# Patient Record
Sex: Female | Born: 1970 | Race: Black or African American | Hispanic: No | Marital: Single | Smoking: Former smoker
Health system: Southern US, Community
[De-identification: ages and names within clinical notes are randomized; demographics above are authoritative.]

## PROBLEM LIST (undated history)

## (undated) DIAGNOSIS — I10 Essential (primary) hypertension: Secondary | ICD-10-CM

## (undated) DIAGNOSIS — E119 Type 2 diabetes mellitus without complications: Secondary | ICD-10-CM

## (undated) HISTORY — PX: APPENDECTOMY: SHX54

## (undated) HISTORY — PX: TONSILLECTOMY: SUR1361

## (undated) HISTORY — PX: REDUCTION MAMMAPLASTY: SUR839

## (undated) HISTORY — PX: HERNIA REPAIR: SHX51

---

## 1991-06-06 HISTORY — PX: REDUCTION MAMMAPLASTY: SUR839

## 2017-06-06 ENCOUNTER — Other Ambulatory Visit: Payer: Self-pay | Admitting: Family Medicine

## 2017-06-06 DIAGNOSIS — Z1231 Encounter for screening mammogram for malignant neoplasm of breast: Secondary | ICD-10-CM

## 2017-06-14 ENCOUNTER — Inpatient Hospital Stay: Admission: RE | Admit: 2017-06-14 | Payer: Self-pay | Source: Ambulatory Visit

## 2017-06-27 ENCOUNTER — Inpatient Hospital Stay: Admission: RE | Admit: 2017-06-27 | Payer: Self-pay | Source: Ambulatory Visit

## 2017-07-11 ENCOUNTER — Encounter (INDEPENDENT_AMBULATORY_CARE_PROVIDER_SITE_OTHER): Payer: Self-pay

## 2017-07-11 ENCOUNTER — Ambulatory Visit
Admission: RE | Admit: 2017-07-11 | Discharge: 2017-07-11 | Disposition: A | Discharge status: Home / Self Care | Payer: BLUE CROSS/BLUE SHIELD | Source: Ambulatory Visit | Attending: Family Medicine | Admitting: Family Medicine

## 2017-07-11 DIAGNOSIS — Z1231 Encounter for screening mammogram for malignant neoplasm of breast: Secondary | ICD-10-CM | POA: Diagnosis not present

## 2017-07-20 ENCOUNTER — Other Ambulatory Visit: Payer: Self-pay | Admitting: *Deleted

## 2017-07-20 ENCOUNTER — Inpatient Hospital Stay
Admission: RE | Admit: 2017-07-20 | Discharge: 2017-07-20 | Disposition: A | Discharge status: Home / Self Care | Payer: Self-pay | Source: Ambulatory Visit | Attending: *Deleted | Admitting: *Deleted

## 2017-07-20 DIAGNOSIS — Z9289 Personal history of other medical treatment: Secondary | ICD-10-CM

## 2018-07-03 HISTORY — PX: ABDOMINAL HYSTERECTOMY: SHX81

## 2018-08-28 ENCOUNTER — Encounter: Admission: RE | Payer: Self-pay | Source: Home / Self Care

## 2018-08-28 ENCOUNTER — Ambulatory Visit: Admission: RE | Admit: 2018-08-28 | Payer: BLUE CROSS/BLUE SHIELD | Source: Home / Self Care | Admitting: Podiatry

## 2018-08-28 SURGERY — OSTECTOMY
Anesthesia: Choice | Laterality: Right

## 2019-03-19 ENCOUNTER — Other Ambulatory Visit: Payer: Self-pay | Admitting: Podiatry

## 2019-04-01 ENCOUNTER — Encounter: Payer: Self-pay | Admitting: *Deleted

## 2019-04-01 ENCOUNTER — Other Ambulatory Visit: Payer: Self-pay

## 2019-04-08 ENCOUNTER — Encounter
Admission: RE | Admit: 2019-04-08 | Discharge: 2019-04-08 | Disposition: A | Discharge status: Home / Self Care | Payer: BC Managed Care – PPO | Source: Ambulatory Visit | Attending: Podiatry | Admitting: Podiatry

## 2019-04-08 ENCOUNTER — Other Ambulatory Visit: Payer: Self-pay

## 2019-04-08 DIAGNOSIS — Z20828 Contact with and (suspected) exposure to other viral communicable diseases: Secondary | ICD-10-CM | POA: Diagnosis not present

## 2019-04-08 DIAGNOSIS — Z01812 Encounter for preprocedural laboratory examination: Secondary | ICD-10-CM | POA: Diagnosis not present

## 2019-04-08 LAB — SARS CORONAVIRUS 2 (TAT 6-24 HRS): SARS Coronavirus 2: NEGATIVE

## 2019-04-08 NOTE — Patient Instructions (Signed)
Your procedure is scheduled on: Friday 11/6 Report to Day Surgery. To find out your arrival time please call [phone] between 1PM - 3PM on Thurs 11/5.  Remember: Instructions that are not followed completely may result in serious medical risk,  up to and including death, or upon the discretion of your surgeon and anesthesiologist your  surgery may need to be rescheduled.     _X__ 1. Do not eat food after midnight the night before your procedure.                 No gum chewing or hard candies. You may drink clear liquids up to 2 hours                 before you are scheduled to arrive for your surgery- DO not drink clear                 liquids within 2 hours of the start of your surgery.                 Clear Liquids include:  water, Gatorade, Black Coffee or Tea (Do not add                 anything to coffee or tea).  __X__2.  On the morning of surgery brush your teeth with toothpaste and water, you                may rinse your mouth with mouthwash if you wish.  Do not swallow any toothpaste of mouthwash.     _X__ 3.  No Alcohol for 24 hours before or after surgery.   _X__ 4.  Do Not Smoke or use e-cigarettes For 24 Hours Prior to Your Surgery.                 Do not use any chewable tobacco products for at least 6 hours prior to                 surgery.  ____  5.  Bring all medications with you on the day of surgery if instructed.   ____  6.  Notify your doctor if there is any change in your medical condition      (cold, fever, infections).     Do not wear jewelry, make-up, hairpins, clips or nail polish.(MD gave permission to keep nail polish on) Do not wear lotions, powders, or perfumes. You may wear deodorant. Do not shave 48 hours prior to surgery. Men may shave face and neck. Do not bring valuables to the hospital.    [facility] is not responsible for any belongings or valuables.  Contacts, dentures or bridgework may not be worn into  surgery. Leave your suitcase in the car. After surgery it may be brought to your room. For patients admitted to the hospital, discharge time is determined by your treatment team.   Patients discharged the day of surgery will not be allowed to drive home.   Please read over the following fact sheets that you were given:    __x__ Take these medicines the morning of surgery with A SIP OF WATER:    1. amLODipine (NORVASC) 10 MG tablet  2. esomeprazole (NEXIUM) 20 MG capsule the night before and morning of surgery  3.   4.  5.  6.  ____ Fleet Enema (as directed)   _x__ Use CHG Soap as directed  ____ Use inhalers on the day of surgery  ____ Stop metformin  2 days prior to surgery    ____ Take 1/2 of usual insulin dose the night before surgery. No insulin the morning          of surgery.   ____ Stop Coumadin/Plavix/aspirin on   _x___ Stop Anti-inflammatories Aleve ibuprofen aspirin today    May take tylenol   ___x_ Stop supplements until after surgery.  Apple Cider Vinegar 600 MG CAPS, Wild Yam, Dioscorea villosa, (WILD YAM PO)  ____ Bring C-Pap to the hospital.

## 2019-04-11 ENCOUNTER — Encounter: Payer: Self-pay | Admitting: *Deleted

## 2019-04-11 ENCOUNTER — Encounter
Admission: RE | Disposition: A | Discharge status: Home / Self Care | Payer: Self-pay | Source: Ambulatory Visit | Attending: Podiatry

## 2019-04-11 ENCOUNTER — Ambulatory Visit
Admission: RE | Admit: 2019-04-11 | Discharge: 2019-04-11 | Disposition: A | Discharge status: Home / Self Care | Payer: BC Managed Care – PPO | Source: Ambulatory Visit | Attending: Podiatry | Admitting: Podiatry

## 2019-04-11 ENCOUNTER — Encounter: Payer: Self-pay | Admitting: Anesthesiology

## 2019-04-11 ENCOUNTER — Other Ambulatory Visit: Payer: Self-pay

## 2019-04-11 DIAGNOSIS — E119 Type 2 diabetes mellitus without complications: Secondary | ICD-10-CM | POA: Insufficient documentation

## 2019-04-11 DIAGNOSIS — M2011 Hallux valgus (acquired), right foot: Secondary | ICD-10-CM | POA: Diagnosis not present

## 2019-04-11 DIAGNOSIS — Z87891 Personal history of nicotine dependence: Secondary | ICD-10-CM | POA: Diagnosis not present

## 2019-04-11 DIAGNOSIS — Z793 Long term (current) use of hormonal contraceptives: Secondary | ICD-10-CM | POA: Insufficient documentation

## 2019-04-11 DIAGNOSIS — I10 Essential (primary) hypertension: Secondary | ICD-10-CM | POA: Insufficient documentation

## 2019-04-11 DIAGNOSIS — Z6841 Body Mass Index (BMI) 40.0 and over, adult: Secondary | ICD-10-CM | POA: Diagnosis not present

## 2019-04-11 DIAGNOSIS — Z79899 Other long term (current) drug therapy: Secondary | ICD-10-CM | POA: Insufficient documentation

## 2019-04-11 DIAGNOSIS — Z7984 Long term (current) use of oral hypoglycemic drugs: Secondary | ICD-10-CM | POA: Insufficient documentation

## 2019-04-11 DIAGNOSIS — Z888 Allergy status to other drugs, medicaments and biological substances status: Secondary | ICD-10-CM | POA: Insufficient documentation

## 2019-04-11 HISTORY — PX: BUNIONECTOMY: SHX129

## 2019-04-11 HISTORY — DX: Essential (primary) hypertension: I10

## 2019-04-11 HISTORY — DX: Type 2 diabetes mellitus without complications: E11.9

## 2019-04-11 LAB — GLUCOSE, CAPILLARY
Glucose-Capillary: 336 mg/dL — ABNORMAL HIGH (ref 70–99)
Glucose-Capillary: 289 mg/dL — ABNORMAL HIGH (ref 70–99)

## 2019-04-11 SURGERY — BUNIONECTOMY
Anesthesia: General | Site: Toe | Laterality: Right

## 2019-04-11 MED ORDER — POVIDONE-IODINE 7.5 % EX SOLN
Freq: Once | CUTANEOUS | Status: DC
  Filled 2019-04-11: qty 118

## 2019-04-11 MED ORDER — PROPOFOL 10 MG/ML IV BOLUS
INTRAVENOUS | Status: AC
  Filled 2019-04-11: qty 20

## 2019-04-11 MED ORDER — BUPIVACAINE HCL (PF) 0.5 % IJ SOLN
INTRAMUSCULAR | Status: DC | PRN
  Administered 2019-04-11: 20 mL

## 2019-04-11 MED ORDER — INSULIN ASPART 100 UNIT/ML ~~LOC~~ SOLN
SUBCUTANEOUS | Status: AC
  Filled 2019-04-11: qty 1

## 2019-04-11 MED ORDER — INSULIN ASPART 100 UNIT/ML ~~LOC~~ SOLN
8.0000 [IU] | Freq: Once | SUBCUTANEOUS | Status: AC
  Administered 2019-04-11: 09:00:00 8 [IU] via SUBCUTANEOUS

## 2019-04-11 MED ORDER — MIDAZOLAM HCL 2 MG/2ML IJ SOLN
INTRAMUSCULAR | Status: AC
  Filled 2019-04-11: qty 2

## 2019-04-11 MED ORDER — SODIUM CHLORIDE 0.9 % IV SOLN
INTRAVENOUS | Status: DC
  Administered 2019-04-11: 09:00:00 via INTRAVENOUS

## 2019-04-11 MED ORDER — FENTANYL CITRATE (PF) 100 MCG/2ML IJ SOLN
INTRAMUSCULAR | Status: DC | PRN
  Administered 2019-04-11: 100 ug via INTRAVENOUS

## 2019-04-11 MED ORDER — PROPOFOL 500 MG/50ML IV EMUL
INTRAVENOUS | Status: DC | PRN
  Administered 2019-04-11: 40 ug/kg/min via INTRAVENOUS

## 2019-04-11 MED ORDER — CEFAZOLIN SODIUM-DEXTROSE 2-4 GM/100ML-% IV SOLN
2.0000 g | INTRAVENOUS | Status: AC
  Administered 2019-04-11: 2 g via INTRAVENOUS

## 2019-04-11 MED ORDER — BUPIVACAINE HCL (PF) 0.5 % IJ SOLN
INTRAMUSCULAR | Status: AC
  Filled 2019-04-11: qty 30

## 2019-04-11 MED ORDER — LIDOCAINE HCL (PF) 1 % IJ SOLN
INTRAMUSCULAR | Status: AC
  Filled 2019-04-11: qty 30

## 2019-04-11 MED ORDER — FENTANYL CITRATE (PF) 100 MCG/2ML IJ SOLN
INTRAMUSCULAR | Status: AC
  Filled 2019-04-11: qty 2

## 2019-04-11 MED ORDER — LIDOCAINE HCL (CARDIAC) PF 100 MG/5ML IV SOSY
PREFILLED_SYRINGE | INTRAVENOUS | Status: DC | PRN
  Administered 2019-04-11: 80 mg via INTRAVENOUS

## 2019-04-11 MED ORDER — KETAMINE HCL 10 MG/ML IJ SOLN
INTRAMUSCULAR | Status: DC | PRN
  Administered 2019-04-11: 20 mg via INTRAVENOUS

## 2019-04-11 MED ORDER — MIDAZOLAM HCL 5 MG/5ML IJ SOLN
INTRAMUSCULAR | Status: DC | PRN
  Administered 2019-04-11: 2 mg via INTRAVENOUS

## 2019-04-11 MED ORDER — ONDANSETRON HCL 4 MG/2ML IJ SOLN: 4.0000 mg | Freq: Once | INTRAMUSCULAR | Status: DC | PRN

## 2019-04-11 MED ORDER — OXYCODONE-ACETAMINOPHEN 5-325 MG PO TABS: 1.0000 | ORAL_TABLET | Freq: Four times a day (QID) | ORAL | 0 refills | Status: AC | PRN

## 2019-04-11 MED ORDER — PROPOFOL 10 MG/ML IV BOLUS
INTRAVENOUS | Status: DC | PRN
  Administered 2019-04-11: 26 mg via INTRAVENOUS

## 2019-04-11 MED ORDER — BUPIVACAINE LIPOSOME 1.3 % IJ SUSP
INTRAMUSCULAR | Status: AC
  Filled 2019-04-11: qty 20

## 2019-04-11 MED ORDER — SODIUM CHLORIDE FLUSH 0.9 % IV SOLN
INTRAVENOUS | Status: AC
  Filled 2019-04-11: qty 50

## 2019-04-11 MED ORDER — BUPIVACAINE LIPOSOME 1.3 % IJ SUSP
INTRAMUSCULAR | Status: DC | PRN
  Administered 2019-04-11 (×2): 10 mL

## 2019-04-11 MED ORDER — CEFAZOLIN SODIUM-DEXTROSE 2-4 GM/100ML-% IV SOLN
INTRAVENOUS | Status: AC
  Filled 2019-04-11: qty 100

## 2019-04-11 MED ORDER — FENTANYL CITRATE (PF) 100 MCG/2ML IJ SOLN: 25.0000 ug | INTRAMUSCULAR | Status: DC | PRN

## 2019-04-11 MED ORDER — DEXMEDETOMIDINE HCL IN NACL 80 MCG/20ML IV SOLN
INTRAVENOUS | Status: AC
  Filled 2019-04-11: qty 20

## 2019-04-11 SURGICAL SUPPLY — 51 items
BLADE MED AGGRESSIVE (BLADE) ×2 IMPLANT
BLADE OSC/SAGITTAL MD 5.5X18 (BLADE) ×2 IMPLANT
BLADE SURG 15 STRL LF DISP TIS (BLADE) ×2 IMPLANT
BLADE SURG 15 STRL SS (BLADE) ×2
BLADE SURG MINI STRL (BLADE) ×2 IMPLANT
BNDG CONFORM 3 STRL LF (GAUZE/BANDAGES/DRESSINGS) ×2 IMPLANT
BNDG ELASTIC 4X5.8 VLCR NS LF (GAUZE/BANDAGES/DRESSINGS) ×2 IMPLANT
BNDG ELASTIC 6X5.8 VLCR NS LF (GAUZE/BANDAGES/DRESSINGS) ×2 IMPLANT
BNDG ESMARK 4X12 TAN STRL LF (GAUZE/BANDAGES/DRESSINGS) ×2 IMPLANT
CANISTER SUCT 1200ML W/VALVE (MISCELLANEOUS) ×2 IMPLANT
COVER WAND RF STERILE (DRAPES) ×2 IMPLANT
CUFF TOURN SGL QUICK 12 (TOURNIQUET CUFF) IMPLANT
CUFF TOURN SGL QUICK 18X4 (TOURNIQUET CUFF) ×2 IMPLANT
DRAPE FLUOR MINI C-ARM 54X84 (DRAPES) ×2 IMPLANT
DURAPREP 26ML APPLICATOR (WOUND CARE) ×2 IMPLANT
ELECT REM PT RETURN 9FT ADLT (ELECTROSURGICAL) ×2
ELECTRODE REM PT RTRN 9FT ADLT (ELECTROSURGICAL) ×1 IMPLANT
GAUZE 4X4 16PLY RFD (DISPOSABLE) ×2 IMPLANT
GAUZE SPONGE 4X4 12PLY STRL (GAUZE/BANDAGES/DRESSINGS) ×2 IMPLANT
GAUZE XEROFORM 1X8 LF (GAUZE/BANDAGES/DRESSINGS) ×2 IMPLANT
GLOVE BIO SURGEON STRL SZ7.5 (GLOVE) ×2 IMPLANT
GLOVE INDICATOR 8.0 STRL GRN (GLOVE) ×2 IMPLANT
GOWN STRL REUS W/ TWL LRG LVL3 (GOWN DISPOSABLE) ×2 IMPLANT
GOWN STRL REUS W/TWL LRG LVL3 (GOWN DISPOSABLE) ×2
NEEDLE FILTER BLUNT 18X 1/2SAF (NEEDLE) ×1
NEEDLE FILTER BLUNT 18X1 1/2 (NEEDLE) ×1 IMPLANT
NEEDLE HYPO 22GX1.5 SAFETY (NEEDLE) ×2 IMPLANT
NS IRRIG 500ML POUR BTL (IV SOLUTION) ×2 IMPLANT
PACK EXTREMITY ARMC (MISCELLANEOUS) ×2 IMPLANT
PAD CAST CTTN 4X4 STRL (SOFTGOODS) ×1 IMPLANT
PAD PREP 24X41 OB/GYN DISP (PERSONAL CARE ITEMS) ×2 IMPLANT
PADDING CAST COTTON 4X4 STRL (SOFTGOODS) ×1
PENCIL ELECTRO HAND CTR (MISCELLANEOUS) ×2 IMPLANT
PIN BALLS 3/8 F/.054-.062 WIRE (MISCELLANEOUS) ×2 IMPLANT
RASP SM TEAR CROSS CUT (RASP) ×2 IMPLANT
STOCKINETTE STRL 6IN 960660 (GAUZE/BANDAGES/DRESSINGS) ×2 IMPLANT
STRAP SAFETY 5IN WIDE (MISCELLANEOUS) ×2 IMPLANT
STRIP CLOSURE SKIN 1/4X4 (GAUZE/BANDAGES/DRESSINGS) ×2 IMPLANT
SUT ETHILON 5-0 FS-2 18 BLK (SUTURE) IMPLANT
SUT MNCRL 4-0 (SUTURE) ×1
SUT MNCRL 4-0 27XMFL (SUTURE) ×1
SUT VIC AB 3-0 SH 27 (SUTURE) ×1
SUT VIC AB 3-0 SH 27X BRD (SUTURE) ×1 IMPLANT
SUT VIC AB 4-0 FS2 27 (SUTURE) ×2 IMPLANT
SUT VIC AB 4-0 RB1 27 (SUTURE)
SUT VIC AB 4-0 RB1 27X BRD (SUTURE) IMPLANT
SUTURE MNCRL 4-0 27XMF (SUTURE) ×1 IMPLANT
SYR 10ML LL (SYRINGE) ×2 IMPLANT
SYR 50ML LL SCALE MARK (SYRINGE) ×2 IMPLANT
WIRE Z .045 C-WIRE SPADE TIP (WIRE) ×4 IMPLANT
WIRE Z .062 C-WIRE SPADE TIP (WIRE) ×4 IMPLANT

## 2019-04-11 NOTE — Anesthesia Preprocedure Evaluation (Addendum)
Anesthesia Evaluation  Patient identified by MRN, date of birth, ID band Patient awake    Reviewed: Allergy & Precautions, NPO status , Patient's Chart, lab work & pertinent test results  History of Anesthesia Complications Negative for: history of anesthetic complications  Airway Mallampati: III       Dental   Pulmonary neg sleep apnea, neg COPD, Not current smoker, former smoker,           Cardiovascular hypertension, Pt. on medications (-) Past MI and (-) CHF (-) dysrhythmias (-) Valvular Problems/Murmurs     Neuro/Psych neg Seizures    GI/Hepatic Neg liver ROS, neg GERD  ,  Endo/Other  diabetes, Type 2, Oral Hypoglycemic AgentsMorbid obesity  Renal/GU negative Renal ROS     Musculoskeletal   Abdominal   Peds  Hematology   Anesthesia Other Findings   Reproductive/Obstetrics                            Anesthesia Physical Anesthesia Plan  ASA: III  Anesthesia Plan: General   Post-op Pain Management:    Induction:   PONV Risk Score and Plan:   Airway Management Planned: Nasal Cannula  Additional Equipment:   Intra-op Plan:   Post-operative Plan:   Informed Consent: I have reviewed the patients History and Physical, chart, labs and discussed the procedure including the risks, benefits and alternatives for the proposed anesthesia with the patient or authorized representative who has indicated his/her understanding and acceptance.       Plan Discussed with:   Anesthesia Plan Comments:         Anesthesia Quick Evaluation

## 2019-04-11 NOTE — Transfer of Care (Signed)
Immediate Anesthesia Transfer of Care Note  Patient: Blimi Godby  Procedure(s) Performed: MODIFIED MCBRIDE/KELLER RIGHT IV W/LOCAL (Right Toe)  Patient Location: PACU  Anesthesia Type:MAC  Level of Consciousness: awake, alert  and oriented  Airway & Oxygen Therapy: Patient Spontanous Breathing and Patient connected to nasal cannula oxygen  Post-op Assessment: Report given to RN and Post -op Vital signs reviewed and stable  Post vital signs: Reviewed and stable  Last Vitals:  Vitals Value Taken Time  BP    Temp    Pulse 88 04/11/19 1019  Resp    SpO2 99 % 04/11/19 1019  Vitals shown include unvalidated device data.  Last Pain:  Vitals:   04/11/19 0811  TempSrc: Tympanic  PainSc: 0-No pain         Complications: No apparent anesthesia complications

## 2019-04-11 NOTE — Anesthesia Postprocedure Evaluation (Signed)
Anesthesia Post Note  Patient: Sarah Melton  Procedure(s) Performed: MODIFIED MCBRIDE/KELLER RIGHT IV W/LOCAL (Right Toe)  Patient location during evaluation: PACU Anesthesia Type: General Level of consciousness: awake and alert Pain management: pain level controlled Vital Signs Assessment: post-procedure vital signs reviewed and stable Respiratory status: spontaneous breathing and respiratory function stable Cardiovascular status: stable Anesthetic complications: no     Last Vitals:  Vitals:   04/11/19 1050 04/11/19 1107  BP: (!) 162/99 (!) 144/108  Pulse: 80 85  Resp:  18  Temp: 36.5 C 36.7 C  SpO2: 100% 100%    Last Pain:  Vitals:   04/11/19 1107  TempSrc: Temporal  PainSc: 0-No pain                 Cymone Yeske K

## 2019-04-11 NOTE — Op Note (Signed)
Operative note   Surgeon:Brissa Asante Lawyer: None    Preop diagnosis: Hallux valgus deformity right foot    Postop diagnosis: Same    Procedure: Hallux valgus correction with exostectomy dorsomedial first MTPJ without sesamoidectomy    EBL: 10 mL    Anesthesia:local and IV sedation.  Local consisted of a total of 20 mL of 0.5% bupivacaine and 20 mL of Exparel long-acting anesthetic around the surgical site    Hemostasis: None    Specimen: None    Complications: None    Operative indications:Sarah Melton is an 48 y.o. that presents today for surgical intervention.  The risks/benefits/alternatives/complications have been discussed and consent has been given.    Procedure:  Patient was brought into the OR and placed on the operating table in thesupine position. After anesthesia was obtained theright lower extremity was prepped and draped in usual sterile fashion.  Attention was directed to the dorsomedial first MTPJ where dorsomedial incision was performed.  Sharp and blunt dissection carried down to the capsule.  A longitudinal capsulotomy was performed.  The medial and dorsomedial eminence was then noted and dissected away at this time.  Next with a power saw the dorsomedial eminence was then transected.  The area was then smoothed with a power rasp.  The wound was flushed with copious amounts of irrigation.  Closure was then performed with a 3-0 Vicryl for the capsular layer, 4-0 Vicryl the subcutaneous tissue and a 4-0 Monocryl for the skin.    Patient tolerated the procedure and anesthesia well.  Was transported from the OR to the PACU with all vital signs stable and vascular status intact. To be discharged per routine protocol.  Will follow up in approximately 1 week in the outpatient clinic.

## 2019-04-11 NOTE — Anesthesia Post-op Follow-up Note (Signed)
Anesthesia QCDR form completed.        

## 2019-04-11 NOTE — H&P (Signed)
HISTORY AND PHYSICAL INTERVAL NOTE:  04/11/2019  9:19 AM  Sarah Melton  has presented today for surgery, with the diagnosis of M20.11  [name].  The various methods of treatment have been discussed with the patient.  No guarantees were given.  After consideration of risks, benefits and other options for treatment, the patient has consented to surgery.  I have reviewed the patients' chart and labs.     A history and physical examination was performed in my office.  The patient was reexamined.  There have been no changes to this history and physical examination.  Sarah Melton A

## 2019-04-11 NOTE — Discharge Instructions (Signed)
AMBULATORY SURGERY  °DISCHARGE INSTRUCTIONS ° ° °1) The drugs that you were given will stay in your system until tomorrow so for the next 24 hours you should not: ° °A) Drive an automobile °B) Make any legal decisions °C) Drink any alcoholic beverage ° ° °2) You may resume regular meals tomorrow.  Today it is better to start with liquids and gradually work up to solid foods. ° °You may eat anything you prefer, but it is better to start with liquids, then soup and crackers, and gradually work up to solid foods. ° ° °3) Please notify your doctor immediately if you have any unusual bleeding, trouble breathing, redness and pain at the surgery site, drainage, fever, or pain not relieved by medication. °4)  ° °5) Your post-operative visit with Dr.                     °           °     is: Date:                        Time:   ° °Please call to schedule your post-operative visit. ° °6) Additional Instructions: ° ° ° ° °[facility] °[facility] ° °POST OPERATIVE INSTRUCTIONS FOR DR. TROXLER, DR. FOWLER, AND DR. BAKER °[facility] ° ° °1. Take your medication as prescribed.  Pain medication should be taken only as needed. ° °2. Keep the dressing clean, dry and intact. ° °3. Keep your foot elevated above the heart level for the first 48 hours. ° °4. Walking to the bathroom and brief periods of walking are acceptable, unless we have instructed you to be non-weight bearing. ° °5. Always wear your post-op shoe when walking.  Always use your crutches if you are to be non-weight bearing. ° °6. Do not take a shower. Baths are permissible as long as the foot is kept out of the water.  ° °7. Every hour you are awake:  °- Bend your knee 15 times. °- Flex foot 15 times °- Massage calf 15 times ° °8. Call [facility] ([phone]) if any of the following problems occur: °- You develop a temperature or fever. °- The bandage becomes saturated with blood. °- Medication does not  stop your pain. °- Injury of the foot occurs. °- Any symptoms of infection including redness, odor, or red streaks running from wound. ° °

## 2019-04-14 ENCOUNTER — Encounter: Payer: Self-pay | Admitting: Podiatry

## 2019-05-22 ENCOUNTER — Encounter: Payer: Self-pay | Admitting: Physician Assistant

## 2019-07-17 ENCOUNTER — Ambulatory Visit: Payer: BC Managed Care – PPO | Admitting: Gastroenterology

## 2019-07-17 ENCOUNTER — Other Ambulatory Visit: Payer: Self-pay

## 2019-11-07 ENCOUNTER — Other Ambulatory Visit: Payer: Self-pay | Admitting: Physician Assistant

## 2019-11-07 DIAGNOSIS — Z1231 Encounter for screening mammogram for malignant neoplasm of breast: Secondary | ICD-10-CM

## 2019-11-13 ENCOUNTER — Other Ambulatory Visit: Payer: Self-pay

## 2019-11-13 ENCOUNTER — Ambulatory Visit
Admission: RE | Admit: 2019-11-13 | Discharge: 2019-11-13 | Disposition: A | Discharge status: Home / Self Care | Payer: BC Managed Care – PPO | Source: Ambulatory Visit | Attending: Physician Assistant | Admitting: Physician Assistant

## 2019-11-13 DIAGNOSIS — Z1231 Encounter for screening mammogram for malignant neoplasm of breast: Secondary | ICD-10-CM

## 2019-11-27 ENCOUNTER — Inpatient Hospital Stay
Admission: RE | Admit: 2019-11-27 | Discharge: 2019-11-27 | Disposition: A | Discharge status: Home / Self Care | Payer: Self-pay | Source: Ambulatory Visit | Attending: *Deleted | Admitting: *Deleted

## 2019-11-27 ENCOUNTER — Other Ambulatory Visit: Payer: Self-pay | Admitting: *Deleted

## 2019-11-27 DIAGNOSIS — Z1231 Encounter for screening mammogram for malignant neoplasm of breast: Secondary | ICD-10-CM

## 2020-12-22 ENCOUNTER — Other Ambulatory Visit: Payer: Self-pay | Admitting: Physician Assistant

## 2020-12-22 DIAGNOSIS — Z1231 Encounter for screening mammogram for malignant neoplasm of breast: Secondary | ICD-10-CM

## 2021-01-11 ENCOUNTER — Ambulatory Visit: Payer: BC Managed Care – PPO

## 2021-02-01 ENCOUNTER — Ambulatory Visit
Admission: RE | Admit: 2021-02-01 | Discharge: 2021-02-01 | Disposition: A | Discharge status: Home / Self Care | Payer: Commercial Managed Care - PPO | Source: Ambulatory Visit | Attending: Physician Assistant | Admitting: Physician Assistant

## 2021-02-01 ENCOUNTER — Other Ambulatory Visit: Payer: Self-pay

## 2021-02-01 DIAGNOSIS — Z1231 Encounter for screening mammogram for malignant neoplasm of breast: Secondary | ICD-10-CM | POA: Diagnosis present

## 2022-02-24 IMAGING — MG MM DIGITAL SCREENING BILAT W/ TOMO AND CAD
8 series · 8 of 24 positions shown · non-contrast
Comparison: Previous exam(s).

CLINICAL DATA: Screening.

EXAM:
DIGITAL SCREENING BILATERAL MAMMOGRAM WITH TOMOSYNTHESIS AND CAD
TECHNIQUE: Bilateral screening digital craniocaudal and mediolateral oblique
mammograms were obtained. Bilateral screening digital breast
tomosynthesis was performed. The images were evaluated with
computer-aided detection.

[L MLO synth-2D]
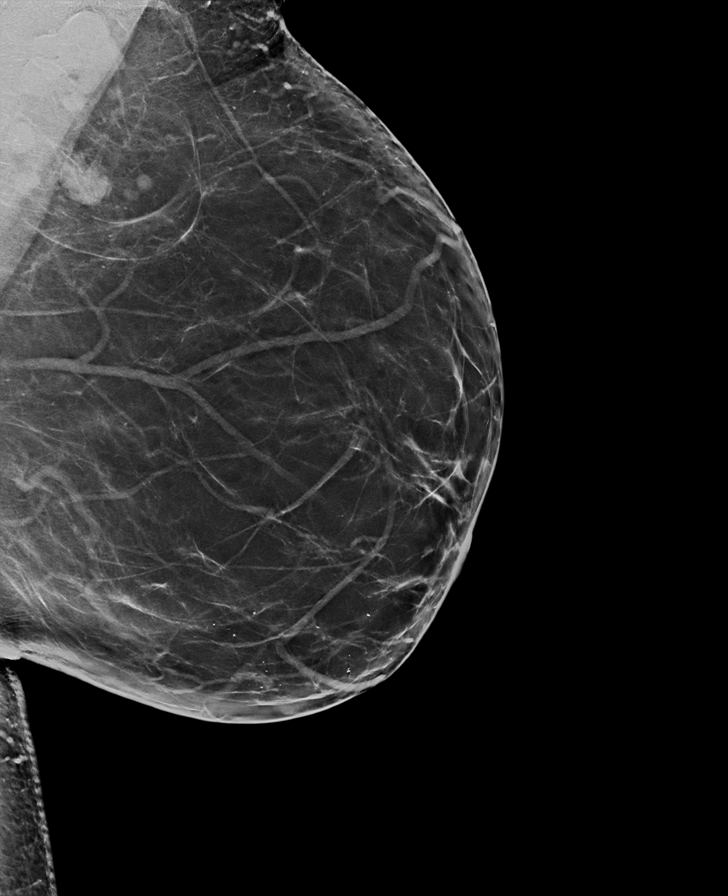

[R CC synth-2D]
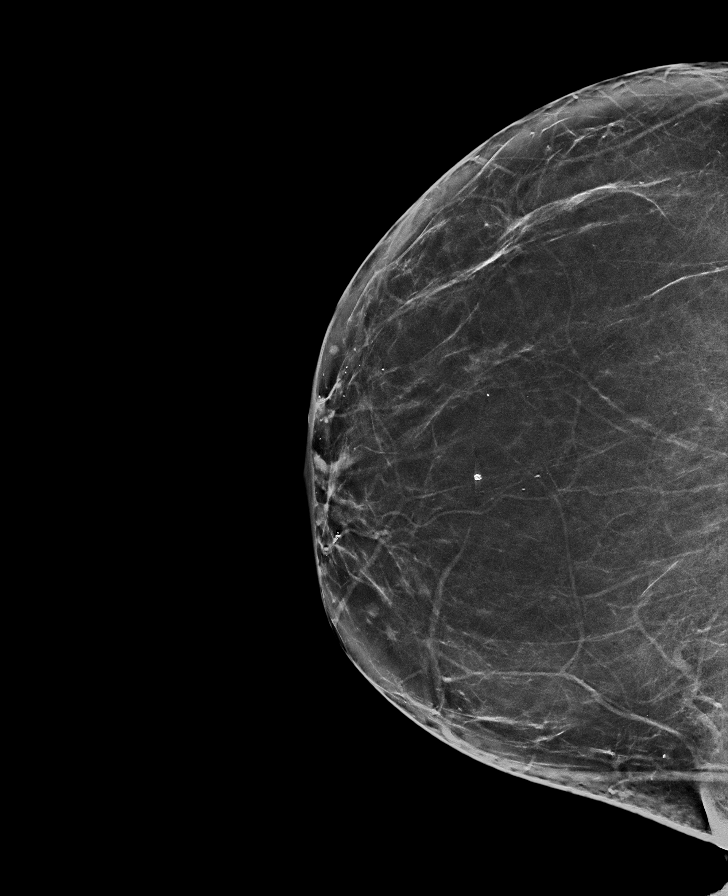

[R MLO synth-2D]
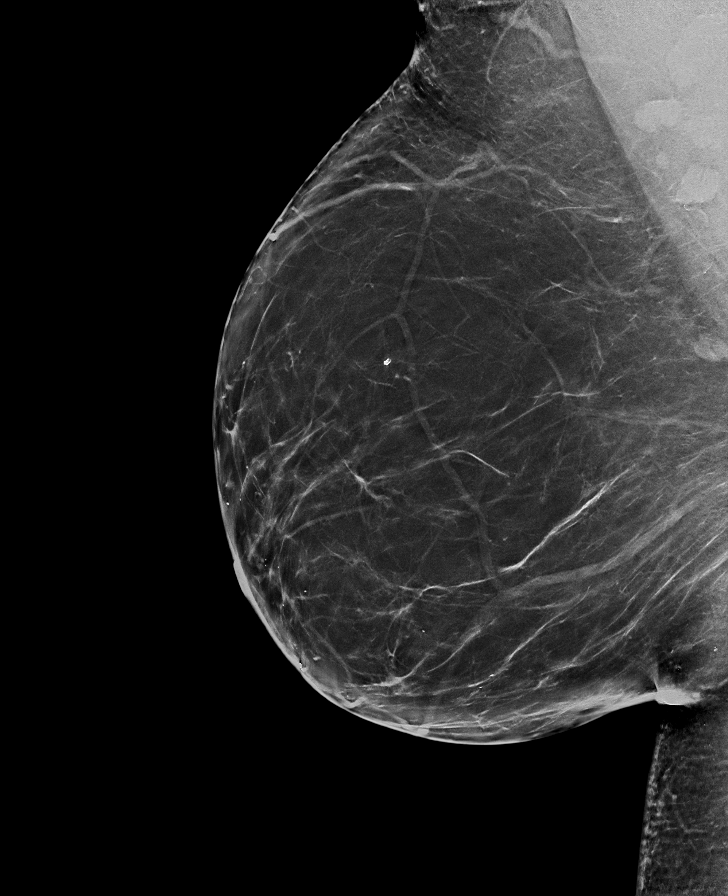

[L CC synth-2D]
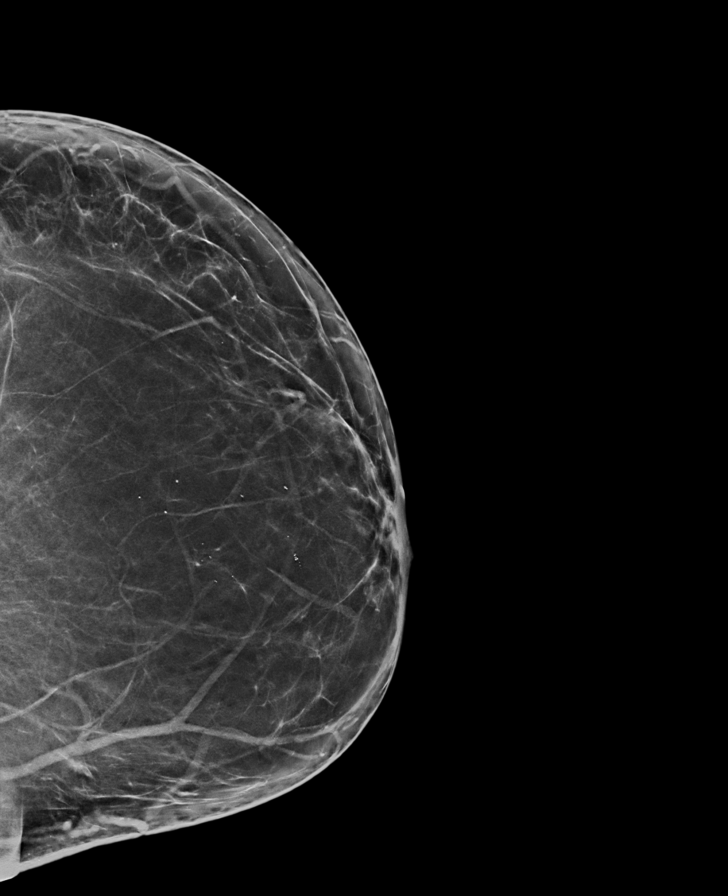

[R CC tomo · tomo slice 38/75.0]
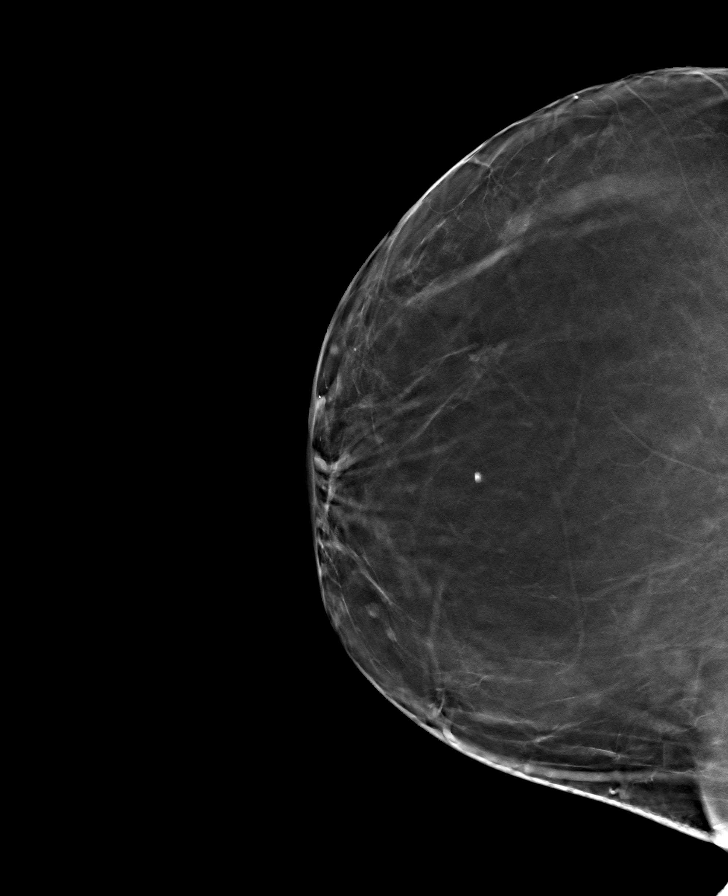

[L MLO tomo · tomo slice 43/85.0]
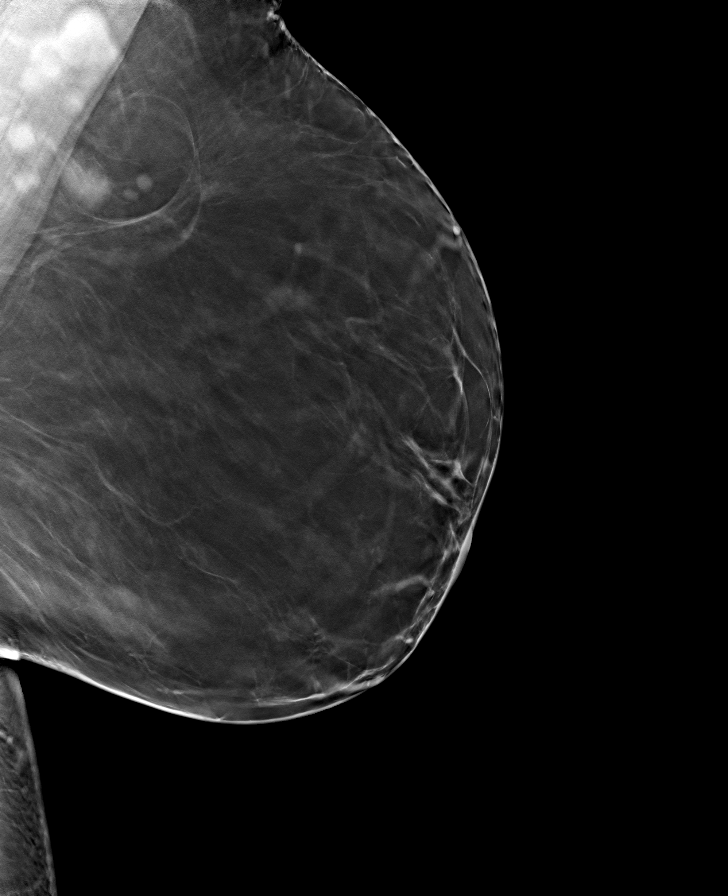

[R MLO tomo · tomo slice 45/90.0]
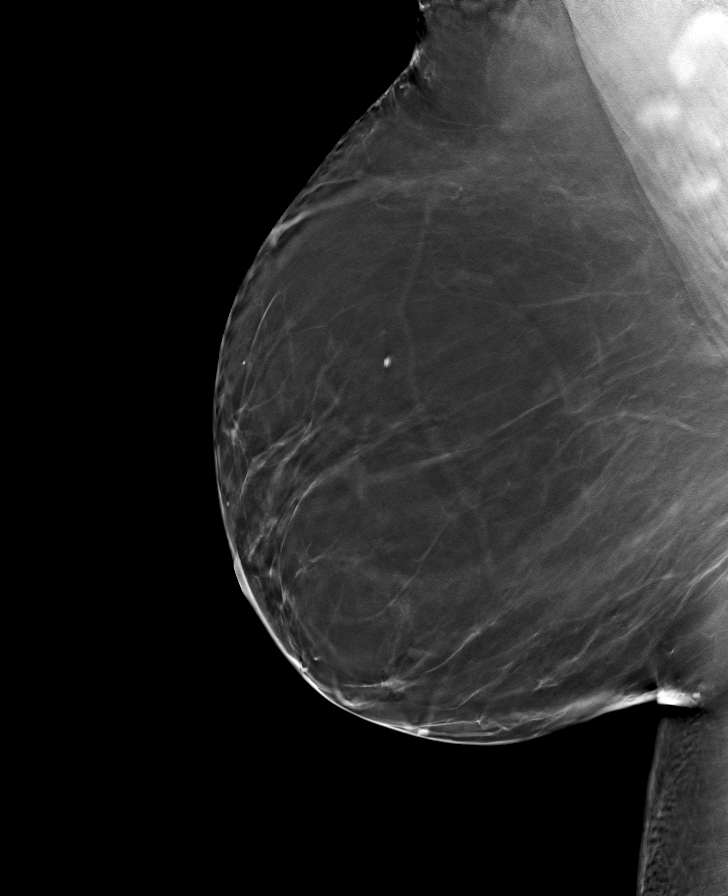

[L CC tomo · tomo slice 39/76.0]
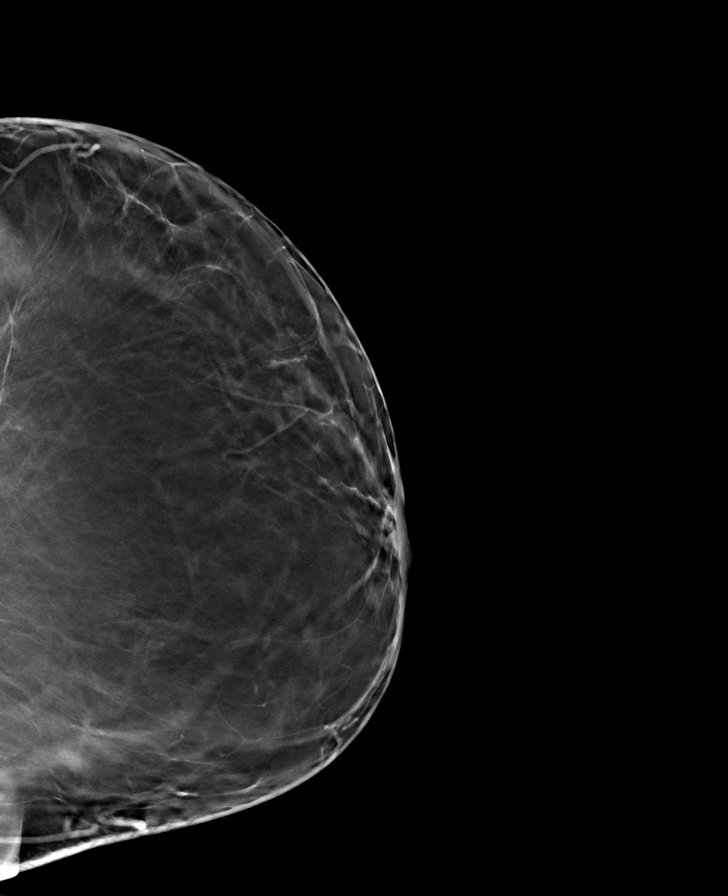

[8 of 24 positions shown; findings below may reference images not displayed]

ACR Breast Density Category b: There are scattered areas of
fibroglandular density.
FINDINGS: There are no findings suspicious for malignancy.
IMPRESSION: No mammographic evidence of malignancy. A result letter of this
screening mammogram will be mailed directly to the patient.

RECOMMENDATION:
Screening mammogram in one year. (Code:51-O-LD2)

BI-RADS CATEGORY  1: Negative.
